# Patient Record
Sex: Female | Born: 2000 | Race: Black or African American | Hispanic: No | Marital: Single
Health system: Southern US, Community
[De-identification: ages and names within clinical notes are randomized; demographics above are authoritative.]

## PROBLEM LIST (undated history)

## (undated) DIAGNOSIS — F319 Bipolar disorder, unspecified: Secondary | ICD-10-CM

---
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
## 2019-01-14 ENCOUNTER — Other Ambulatory Visit: Payer: Self-pay

## 2019-01-14 ENCOUNTER — Encounter: Payer: Self-pay | Admitting: Emergency Medicine

## 2019-01-14 ENCOUNTER — Emergency Department
Admission: EM | Admit: 2019-01-14 | Discharge: 2019-01-14 | Disposition: A | Discharge status: Home / Self Care | Payer: BC Managed Care – PPO | Attending: Emergency Medicine | Admitting: Emergency Medicine

## 2019-01-14 DIAGNOSIS — R102 Pelvic and perineal pain: Secondary | ICD-10-CM | POA: Insufficient documentation

## 2019-01-14 DIAGNOSIS — Z7722 Contact with and (suspected) exposure to environmental tobacco smoke (acute) (chronic): Secondary | ICD-10-CM | POA: Diagnosis not present

## 2019-01-14 HISTORY — DX: Bipolar disorder, unspecified: F31.9

## 2019-01-14 LAB — URINALYSIS, COMPLETE (UACMP) WITH MICROSCOPIC
Bacteria, UA: NONE SEEN
Bilirubin Urine: NEGATIVE
Glucose, UA: NEGATIVE mg/dL
Hgb urine dipstick: NEGATIVE
Ketones, ur: 5 mg/dL — AB
Nitrite: NEGATIVE
Protein, ur: 30 mg/dL — AB
Specific Gravity, Urine: 1.025 (ref 1.005–1.030)
pH: 8 (ref 5.0–8.0)

## 2019-01-14 LAB — POCT PREGNANCY, URINE: Preg Test, Ur: NEGATIVE

## 2019-01-14 LAB — CBC
HCT: 38.8 % (ref 36.0–46.0)
Hemoglobin: 12.7 g/dL (ref 12.0–15.0)
MCH: 28.7 pg (ref 26.0–34.0)
MCHC: 32.7 g/dL (ref 30.0–36.0)
MCV: 87.6 fL (ref 80.0–100.0)
Platelets: 264 10*3/uL (ref 150–400)
RBC: 4.43 MIL/uL (ref 3.87–5.11)
RDW: 12.8 % (ref 11.5–15.5)
WBC: 7.1 10*3/uL (ref 4.0–10.5)
nRBC: 0 % (ref 0.0–0.2)

## 2019-01-14 LAB — COMPREHENSIVE METABOLIC PANEL
ALT: 18 U/L (ref 0–44)
AST: 19 U/L (ref 15–41)
Albumin: 4.3 g/dL (ref 3.5–5.0)
Alkaline Phosphatase: 60 U/L (ref 38–126)
Anion gap: 9 (ref 5–15)
BUN: 13 mg/dL (ref 6–20)
CO2: 25 mmol/L (ref 22–32)
Calcium: 9.4 mg/dL (ref 8.9–10.3)
Chloride: 108 mmol/L (ref 98–111)
Creatinine, Ser: 0.85 mg/dL (ref 0.44–1.00)
GFR calc Af Amer: >60 mL/min (ref >60)
GFR calc non Af Amer: >60 mL/min (ref >60)
Glucose, Bld: 82 mg/dL (ref 70–99)
Potassium: 4.3 mmol/L (ref 3.5–5.1)
Sodium: 142 mmol/L (ref 135–145)
Total Bilirubin: 0.5 mg/dL (ref 0.3–1.2)
Total Protein: 8 g/dL (ref 6.5–8.1)

## 2019-01-14 LAB — VALPROIC ACID LEVEL: Valproic Acid Lvl: 112 ug/mL — ABNORMAL HIGH (ref 50.0–100.0)

## 2019-01-14 MED ORDER — FAMOTIDINE 20 MG PO TABS
20.0000 mg | ORAL_TABLET | Freq: Once | ORAL | Status: AC
  Administered 2019-01-14: 21:00:00 20 mg via ORAL
  Filled 2019-01-14: qty 1

## 2019-01-14 MED ORDER — AZITHROMYCIN 500 MG PO TABS
1000.0000 mg | ORAL_TABLET | Freq: Once | ORAL | Status: AC
  Administered 2019-01-14: 21:00:00 1000 mg via ORAL
  Filled 2019-01-14: qty 2

## 2019-01-14 MED ORDER — FAMOTIDINE 20 MG PO TABS: 20.0000 mg | ORAL_TABLET | Freq: Two times a day (BID) | ORAL | 1 refills | Status: AC

## 2019-01-14 MED ORDER — CEFTRIAXONE SODIUM 250 MG IJ SOLR
250.0000 mg | INTRAMUSCULAR | Status: DC
  Administered 2019-01-14: 250 mg via INTRAMUSCULAR
  Filled 2019-01-14: qty 250

## 2019-01-14 NOTE — ED Triage Notes (Signed)
Pt arrived via POV with multiple medical complaints. Pt states she is having pelvic pain and has been having sex and is concerned she might be pregnant.   Pt reports she has had increased anxiety and intermittently hyperventilating in triage.   Pt states she also recently swallowed a metal ball a few weeks ago which she was seen for here.    Pt has been back on medications 3 weeks ago for bipolar disorder.  Pt takes depakote and trazodone which she states has been helping her.  Pt reports no missed doses of depakote.

## 2019-01-14 NOTE — ED Provider Notes (Signed)
[facility] Emergency Department Provider Note       Time seen: ----------------------------------------- 9:16 PM on 01/14/2019 -----------------------------------------   I have reviewed the triage vital signs and the nursing notes.  HISTORY   Chief Complaint Pelvic Pain    HPI Denise Russo is a 18 y.o. female with a history of bipolar disorder who presents to the ED for pelvic pain.  Patient states she was having sex and is concerned she may be pregnant.  She states she was not having protected sex at the time.  She denies any acute psychiatric issue at this time.  Past Medical History:  Diagnosis Date  . Bipolar disorder [facility])     Patient Active Problem List   Diagnosis Date Noted  . Bipolar disorder, current episode hypomanic ([facility]) 12/24/2018    Allergies Nickel  Social History Social History   Tobacco Use  . Smoking status: Passive Smoke Exposure - Never Smoker  . Smokeless tobacco: Never Used  Substance Use Topics  . Alcohol use: Not on file  . Drug use: Not on file   Review of Systems Constitutional: Negative for fever. Cardiovascular: Negative for chest pain. Respiratory: Negative for shortness of breath. Gastrointestinal: Positive for abdominal pain Musculoskeletal: Negative for back pain. Skin: Negative for rash. Neurological: Negative for headaches, focal weakness or numbness.  All systems negative/normal/unremarkable except as stated in the HPI  ____________________________________________   PHYSICAL EXAM:  VITAL SIGNS: ED Triage Vitals  Enc Vitals Group     BP 01/14/19 2106 (!) 125/105     Pulse Rate 01/14/19 2106 100     Resp 01/14/19 2106 17     Temp 01/14/19 2106 98.4 F (36.9 C)     Temp src --      SpO2 01/14/19 2106 96 %     Weight 01/14/19 1642 180 lb (81.6 kg)     Height 01/14/19 1642 5' (1.524 m)     Head Circumference --      Peak Flow --      Pain Score 01/14/19 2106 4     Pain Loc --       Pain Edu? --      Excl. in GC? --    Constitutional: Alert and oriented. Well appearing and in no distress. Eyes: Conjunctivae are normal. Normal extraocular movements. ENT      Head: Normocephalic and atraumatic.      Nose: No congestion/rhinnorhea.      Mouth/Throat: Mucous membranes are moist.      Neck: No stridor. Cardiovascular: Normal rate, regular rhythm. No murmurs, rubs, or gallops. Respiratory: Normal respiratory effort without tachypnea nor retractions. Breath sounds are clear and equal bilaterally. No wheezes/rales/rhonchi. Gastrointestinal: Soft and nontender. Normal bowel sounds Musculoskeletal: Nontender with normal range of motion in extremities. No lower extremity tenderness nor edema. Neurologic:  Normal speech and language. No gross focal neurologic deficits are appreciated.  Skin:  Skin is warm, dry and intact. No rash noted. Psychiatric: Bizarre mood and affect at times ____________________________________________  ED COURSE:  As part of my medical decision making, I reviewed the following data within the electronic MEDICAL RECORD NUMBER History obtained from family if available, nursing notes, old chart and ekg, as well as notes from prior ED visits. Patient presented for abdominal pain, we will assess with labs and imaging as indicated at this time.   Procedures  Denise Russo was evaluated in Emergency Department on 01/14/2019 for the symptoms described in the history of present illness. She  was evaluated in the context of the global COVID-19 pandemic, which necessitated consideration that the patient might be at risk for infection with the SARS-CoV-2 virus that causes COVID-19. Institutional protocols and algorithms that pertain to the evaluation of patients at risk for COVID-19 are in a state of rapid change based on information released by regulatory bodies including the CDC and federal and state organizations. These policies and algorithms were followed during the  patient's care in the ED.  ____________________________________________   LABS (pertinent positives/negatives)  Labs Reviewed  URINALYSIS, COMPLETE (UACMP) WITH MICROSCOPIC - Abnormal; Notable for the following components:      Result Value   Color, Urine YELLOW (*)    APPearance HAZY (*)    Ketones, ur 5 (*)    Protein, ur 30 (*)    Leukocytes,Ua TRACE (*)    All other components within normal limits  VALPROIC ACID LEVEL - Abnormal; Notable for the following components:   Valproic Acid Lvl 112 (*)    All other components within normal limits  CHLAMYDIA/NGC RT PCR ([facility] ONLY)  COMPREHENSIVE METABOLIC PANEL  CBC  POC URINE PREG, ED  POCT PREGNANCY, URINE  ____________________________________________   DIFFERENTIAL DIAGNOSIS   Medical screening exam, STD, pregnancy, UTI, bipolar disorder  FINAL ASSESSMENT AND PLAN  Medical screening exam, GERD, STI treatment   Plan: The patient had presented for abdominal pain. Patient's labs were reassuring other than a slightly elevated valproic acid level.  I did offer a pelvic exam but she has declined at this time.  She was given Rocephin and Zithromax to cover for STD.  She is also been placed on Pepcid for GERD and is otherwise cleared for outpatient follow-up.   Laurence Aly, MD    Note: This note was generated in part or whole with voice recognition software. Voice recognition is usually quite accurate but there are transcription errors that can and very often do occur. I apologize for any typographical errors that were not detected and corrected.     Earleen Newport, MD 01/14/19 2119

## 2019-01-14 NOTE — ED Notes (Signed)
Reviewed discharge instructions, follow-up care, and prescriptions with patient. Patient verbalized understanding of all information reviewed. Patient stable, with no distress noted at this time.    

## 2019-01-14 NOTE — ED Notes (Signed)
Patient reports unprotected sexual encounter over 1 month ago. Patient c/o lower abdominal pain and vaginal burning for approx 1 month. Patient denies vaginal discharge.

## 2019-01-17 LAB — GC/CHLAMYDIA PROBE AMP
Chlamydia trachomatis, NAA: NEGATIVE
Neisseria Gonorrhoeae by PCR: NEGATIVE

## 2020-05-27 IMAGING — CR DG ABDOMEN 1V
2 series · 2 of 2 positions shown · non-contrast
Comparison: None.

CLINICAL DATA: Swallowed foreign body

EXAM:
ABDOMEN - 1 VIEW

[abdomen kub (1 of 2)]
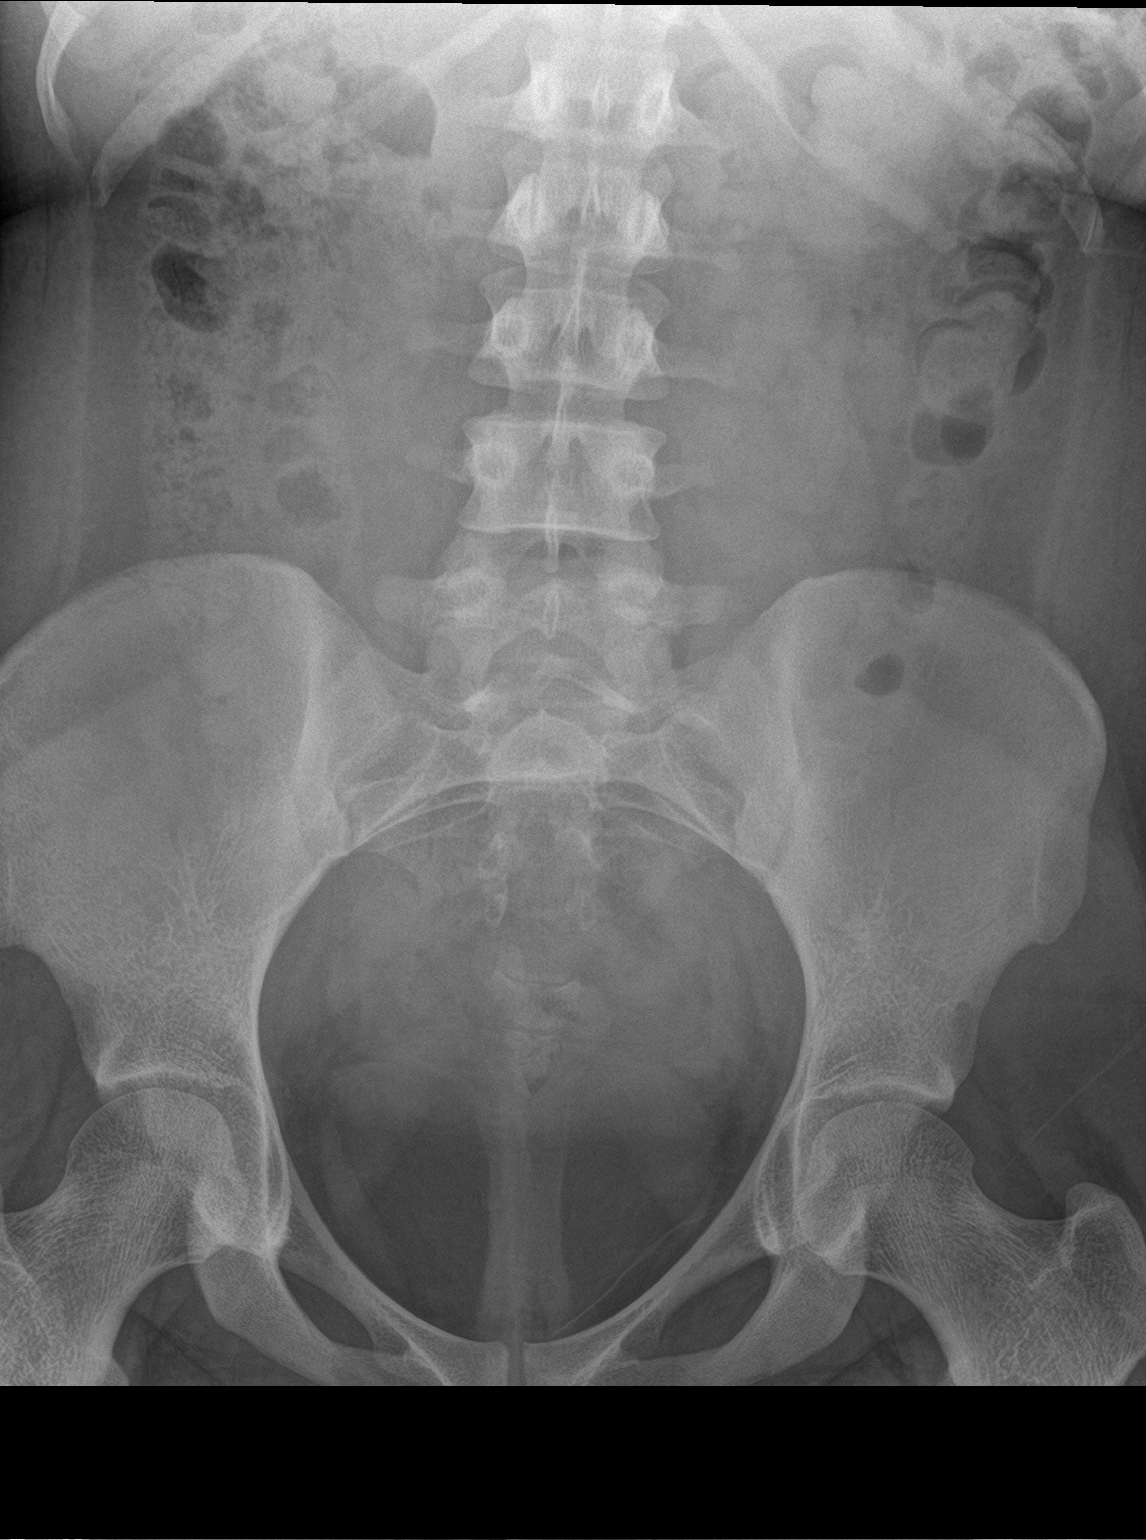

[abdomen kub (2 of 2)]
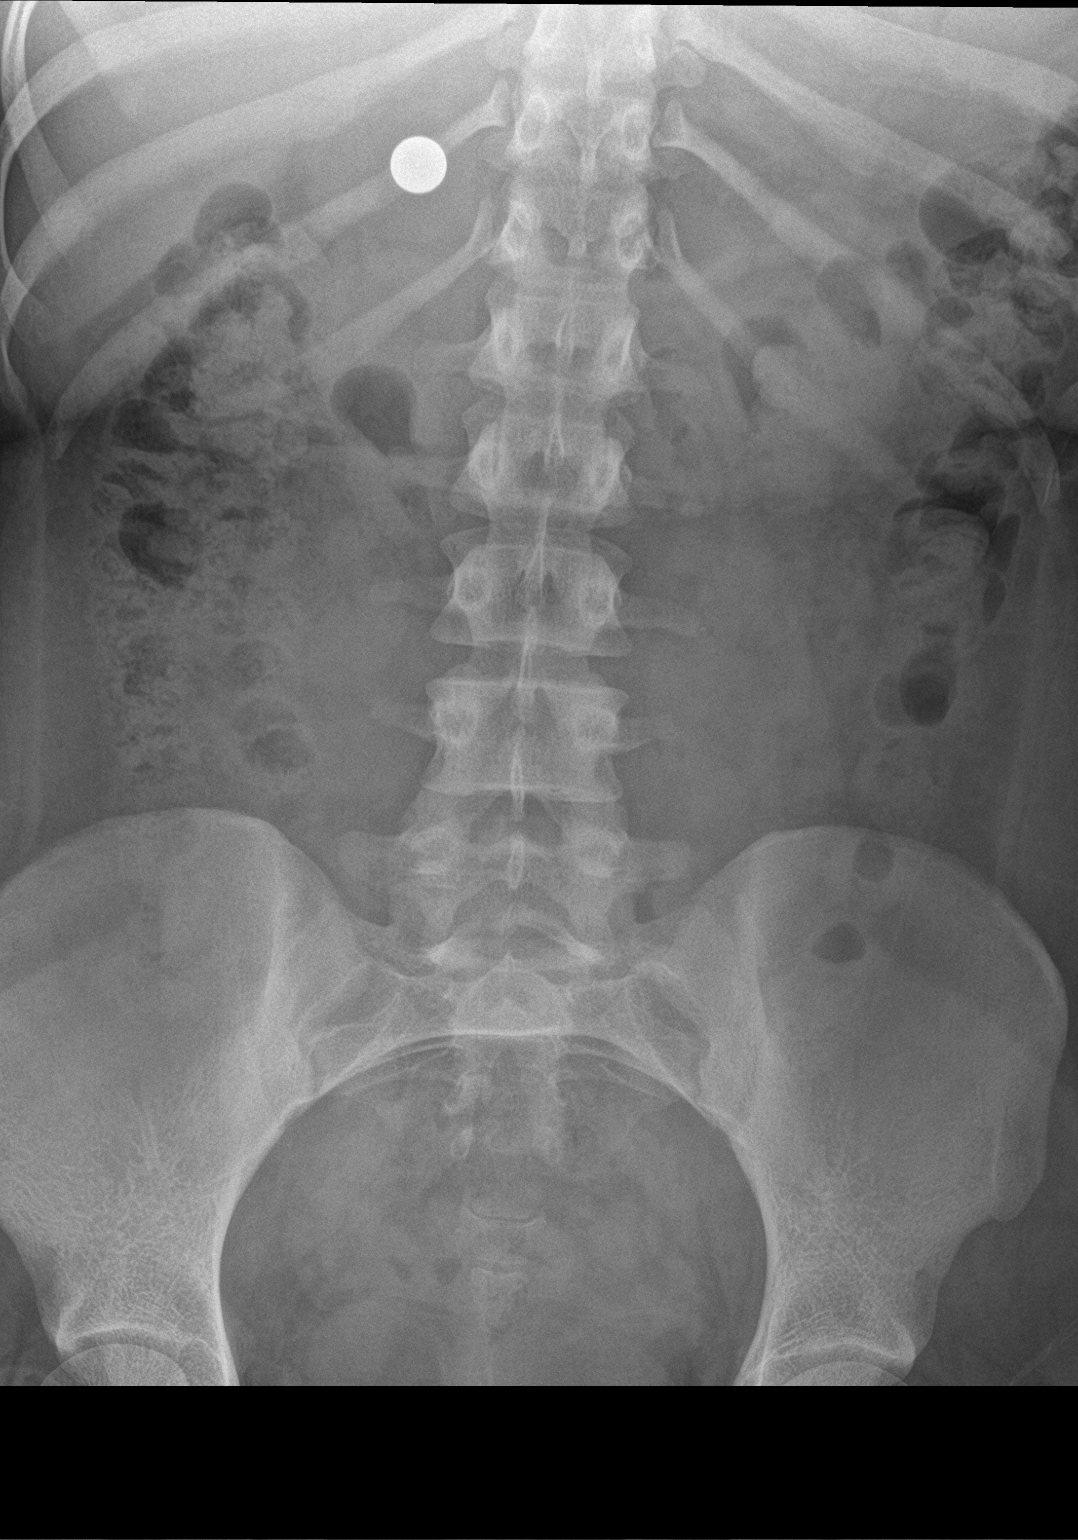

[2 of 2 positions shown; findings below may reference images not displayed]

FINDINGS: Nonobstructed bowel gas pattern. Rounded metallic opacity in the
right upper quadrant, likely over the distal stomach.
IMPRESSION: Rounded metallic foreign object in the right upper quadrant,
probably distal stomach.
